# Patient Record
Sex: Female | Born: 1935 | Race: White | Hispanic: No | Marital: Married | Smoking: Never smoker
Health system: Southern US, Community
[De-identification: ages and names within clinical notes are randomized; demographics above are authoritative.]

## PROBLEM LIST (undated history)

## (undated) DIAGNOSIS — E785 Hyperlipidemia, unspecified: Secondary | ICD-10-CM

## (undated) DIAGNOSIS — I10 Essential (primary) hypertension: Secondary | ICD-10-CM

## (undated) HISTORY — PX: APPENDECTOMY: SHX54

## (undated) HISTORY — DX: Essential (primary) hypertension: I10

## (undated) HISTORY — DX: Hyperlipidemia, unspecified: E78.5

## (undated) HISTORY — PX: CHOLECYSTECTOMY: SHX55

---

## 2004-10-25 ENCOUNTER — Ambulatory Visit: Payer: Self-pay | Admitting: Internal Medicine

## 2005-10-20 ENCOUNTER — Inpatient Hospital Stay: Payer: Self-pay | Admitting: Internal Medicine

## 2005-10-22 ENCOUNTER — Other Ambulatory Visit: Payer: Self-pay

## 2005-11-13 ENCOUNTER — Ambulatory Visit: Payer: Self-pay | Admitting: Internal Medicine

## 2006-01-17 ENCOUNTER — Ambulatory Visit: Payer: Self-pay | Admitting: Ophthalmology

## 2006-01-17 ENCOUNTER — Emergency Department: Payer: Self-pay | Admitting: Emergency Medicine

## 2006-01-18 ENCOUNTER — Ambulatory Visit: Payer: Self-pay | Admitting: Emergency Medicine

## 2006-01-23 ENCOUNTER — Ambulatory Visit: Payer: Self-pay | Admitting: Ophthalmology

## 2006-02-06 ENCOUNTER — Ambulatory Visit: Payer: Self-pay | Admitting: Internal Medicine

## 2007-02-08 ENCOUNTER — Ambulatory Visit: Payer: Self-pay | Admitting: Internal Medicine

## 2007-08-10 IMAGING — CT CT ABDOMEN W/ CM
1 of 2 series · 15 of 32 positions shown, 19 images · non-contrast
Comparison: none

REASON FOR EXAM: Status post two weeks gallbladder surgery. Upper abdominal
pain
COMMENTS:

[Series 2: soft tissue · axial · 0.66mm/px · z∈[-607,-375]mm · 15 of 33 slices shown, 19 images]
[im 2/33  soft-tissue]
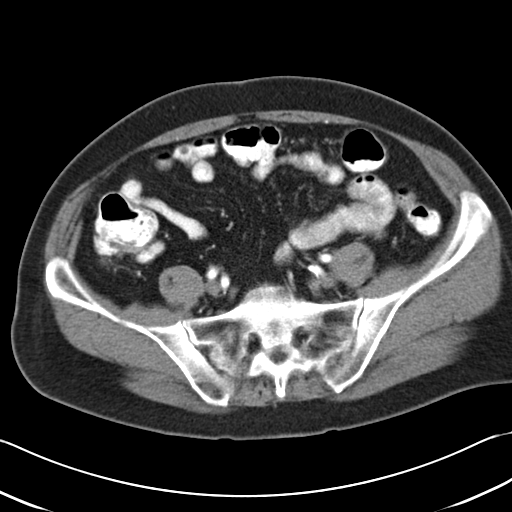
[im 2/33  bone]
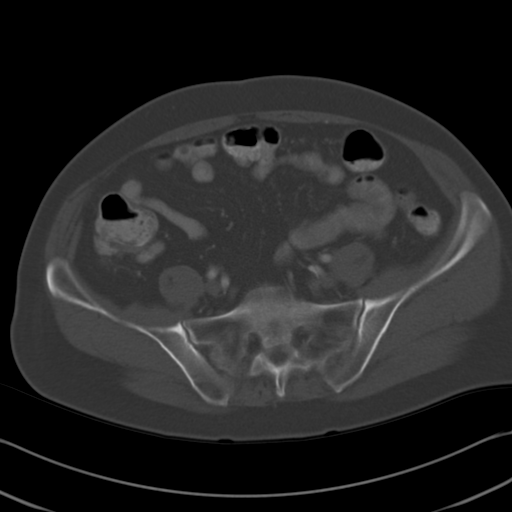
[im 5/33  soft-tissue]
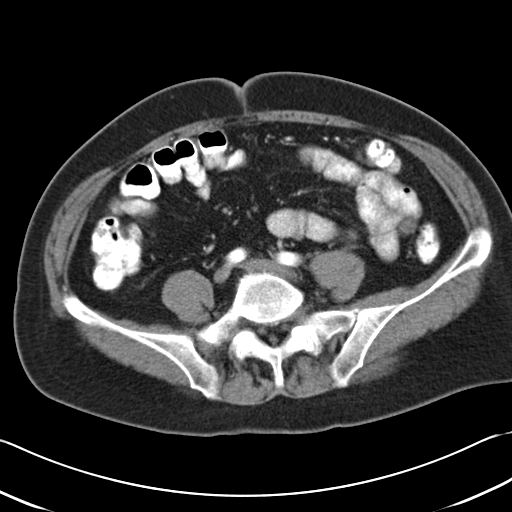
[im 8/33  soft-tissue]
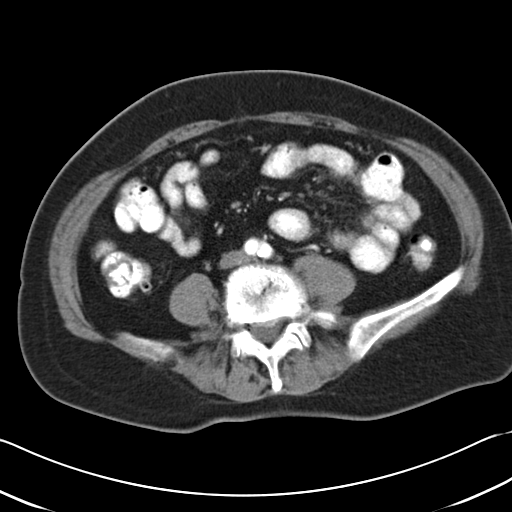
[im 9/33  soft-tissue]
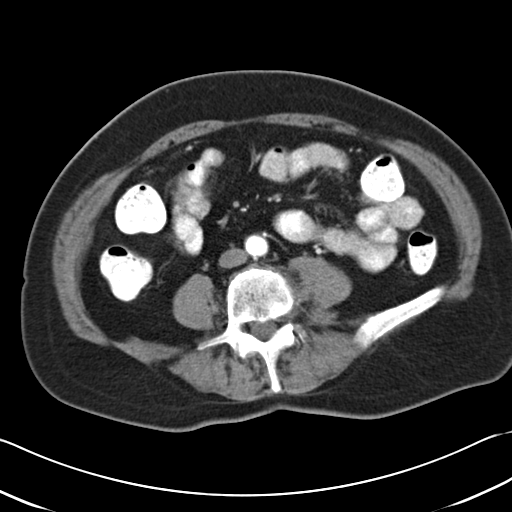
[im 12/33  soft-tissue]
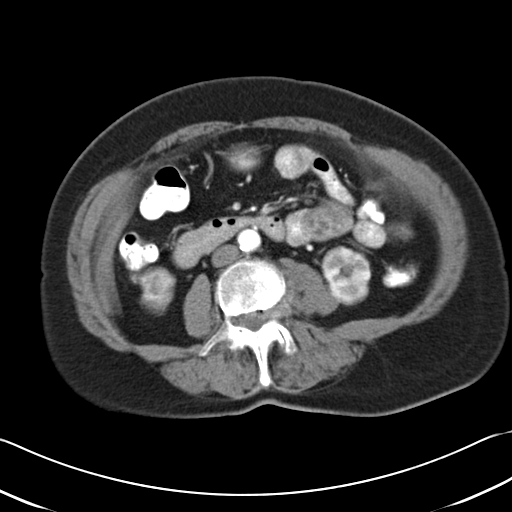
[im 14/33  soft-tissue]
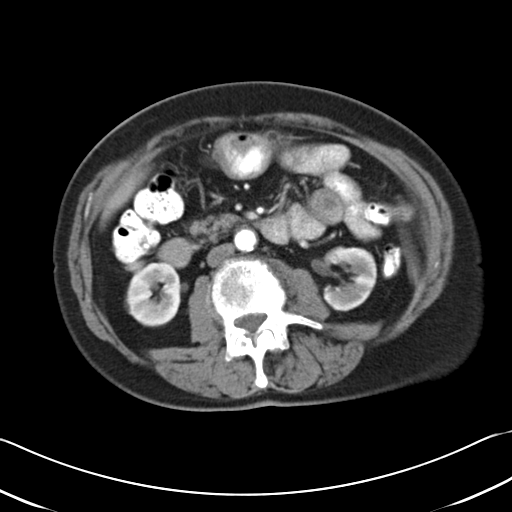
[im 17/33  soft-tissue]
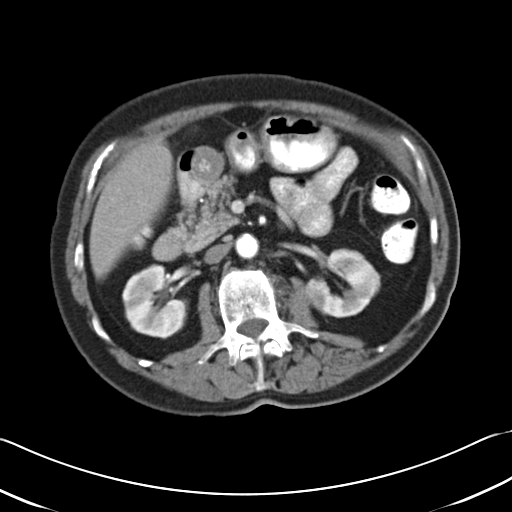
[im 19/33  soft-tissue]
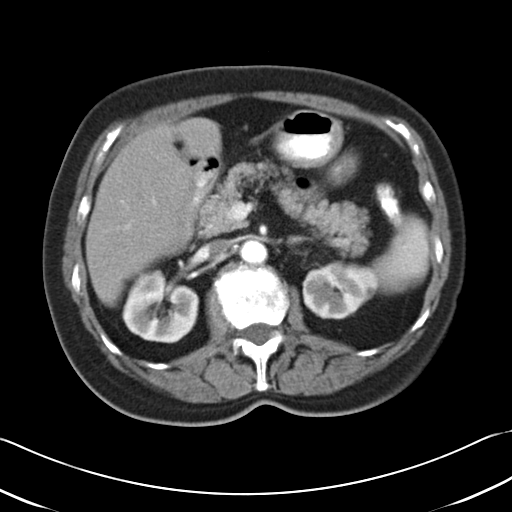
[im 21/33  soft-tissue]
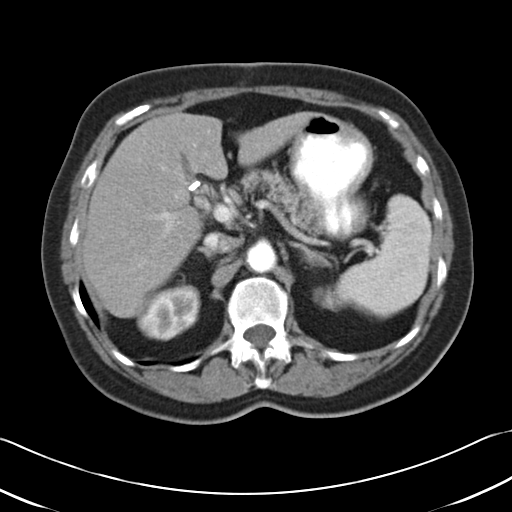
[im 21/33  bone]
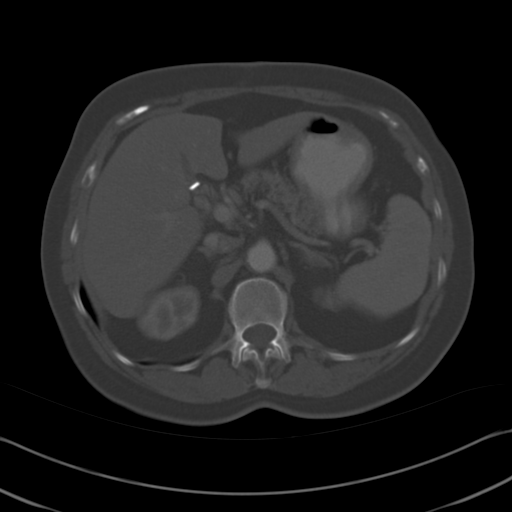
[im 24/33  soft-tissue]
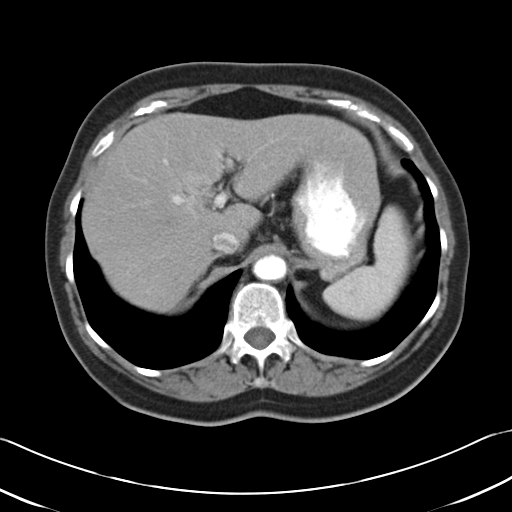
[im 25/33  soft-tissue]
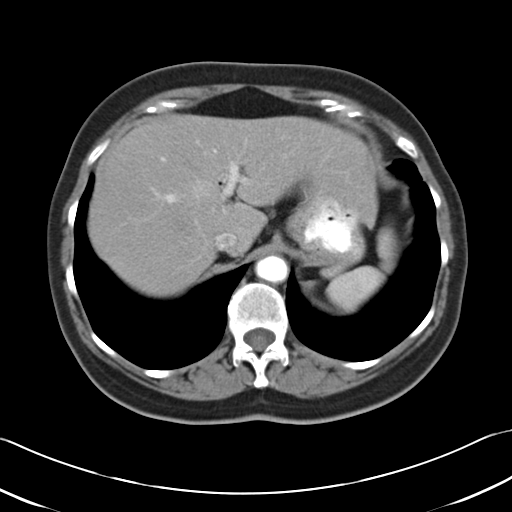
[im 27/33  lung]
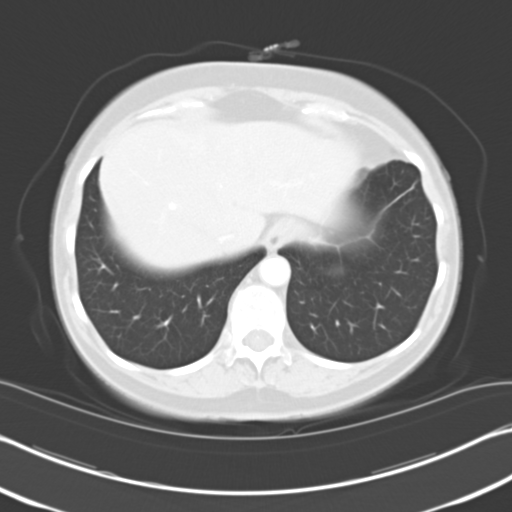
[im 28/33  soft-tissue]
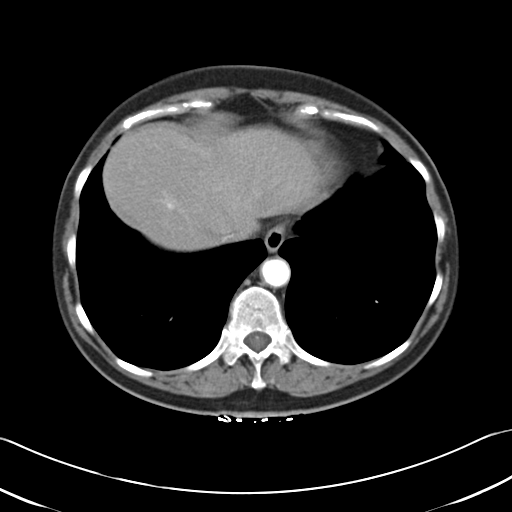
[im 28/33  lung]
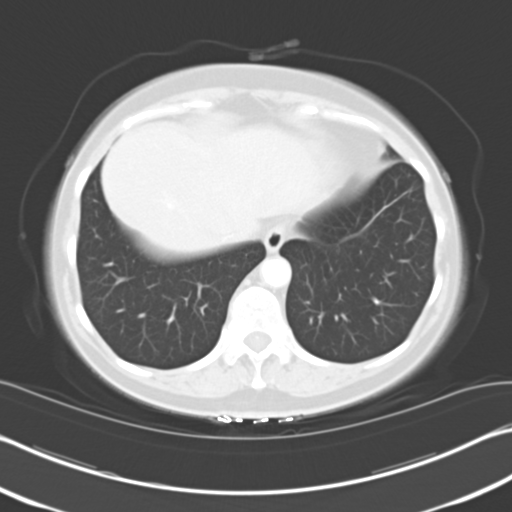
[im 30/33  lung]
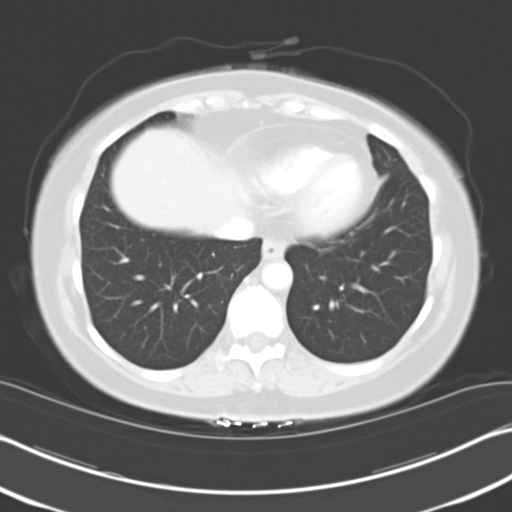
[im 31/33  soft-tissue]
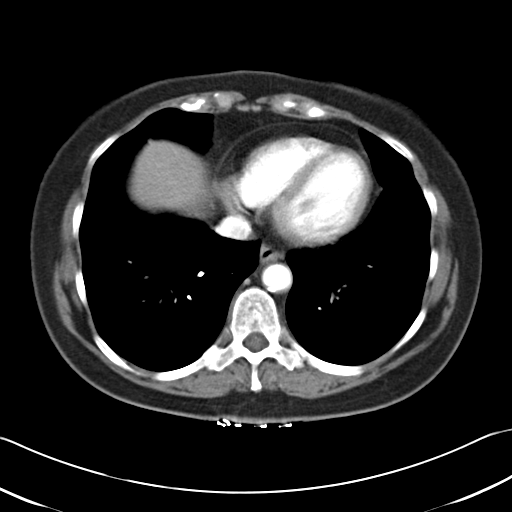
[im 31/33  lung]
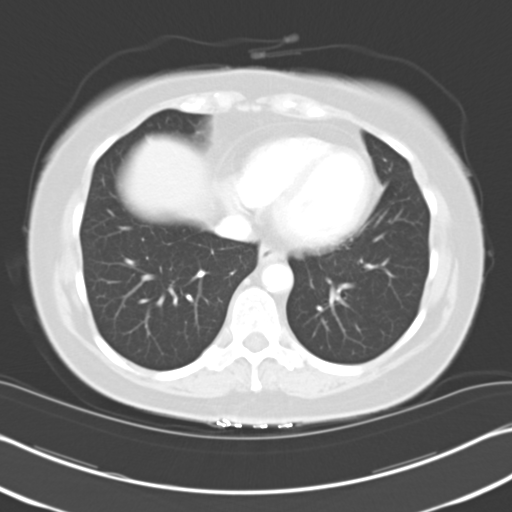

[15 of 32 positions shown; findings below may reference images not displayed]

PROCEDURE:     CT  - CT ABDOMEN STANDARD W  - November 13, 2005  [DATE]

RESULT:     Spiral 8-mm sections were obtained from the lung bases through
the superior iliac crest status post intravenous administration of 63 ml of
[id number].

Evaluation of the lung bases demonstrates no evidence of focal infiltrates,
effusions, or edema.

The liver, spleen, adrenals, and pancreas are unremarkable.  Evaluation of
the RIGHT and LEFT kidneys demonstrates bilateral cortical thinning.  An
area of low attenuation projects within the lateral lower pole region of the
LEFT kidney measuring approximately 5 to 6 mm in diameter too small for
characterization though likely representing a cyst.  No evidence of
abdominal free fluid, drainable loculated fluid collections, masses, or
adenopathy is appreciated.  Surgical clips are demonstrated within the
gallbladder fossa.  This is consistent with the patient's history of prior
cholecystectomy.  The celiac, SMA, IMA, portal veins are opacified and does
not reflect the caliber of the vessels.
IMPRESSION: Unremarkable CT of the abdomen as described above.

## 2008-02-11 ENCOUNTER — Ambulatory Visit: Payer: Self-pay | Admitting: Internal Medicine

## 2008-07-27 ENCOUNTER — Emergency Department: Payer: Self-pay | Admitting: Emergency Medicine

## 2008-07-28 ENCOUNTER — Other Ambulatory Visit: Payer: Self-pay

## 2009-03-09 ENCOUNTER — Ambulatory Visit: Payer: Self-pay | Admitting: Internal Medicine

## 2009-12-19 ENCOUNTER — Emergency Department: Payer: Self-pay | Admitting: Unknown Physician Specialty

## 2009-12-22 ENCOUNTER — Ambulatory Visit: Payer: Self-pay | Admitting: Gastroenterology

## 2010-03-10 ENCOUNTER — Ambulatory Visit: Payer: Self-pay | Admitting: Internal Medicine

## 2011-03-13 ENCOUNTER — Ambulatory Visit: Payer: Self-pay | Admitting: Internal Medicine

## 2011-09-15 IMAGING — US ABDOMEN ULTRASOUND
1 series · 17 of 25 positions shown · non-contrast
Comparison: none

REASON FOR EXAM: ruq pain vomiting  cholecystectomy
COMMENTS:   May transport without cardiac monitor

[Series 1: abdomen ultrasound · 17 of 51 slices shown]
[im 1/51]
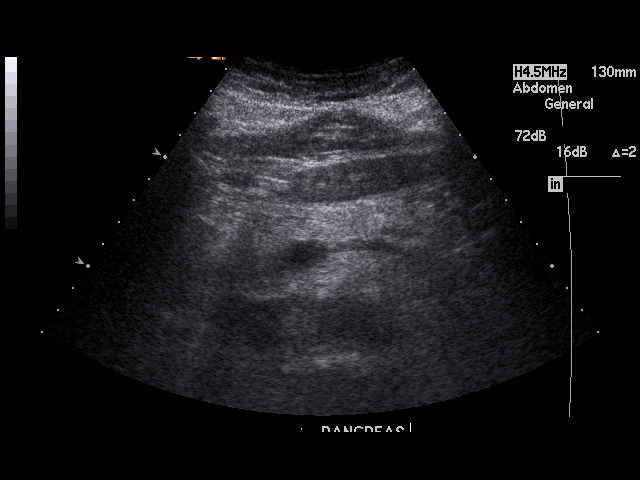
[im 5/51]
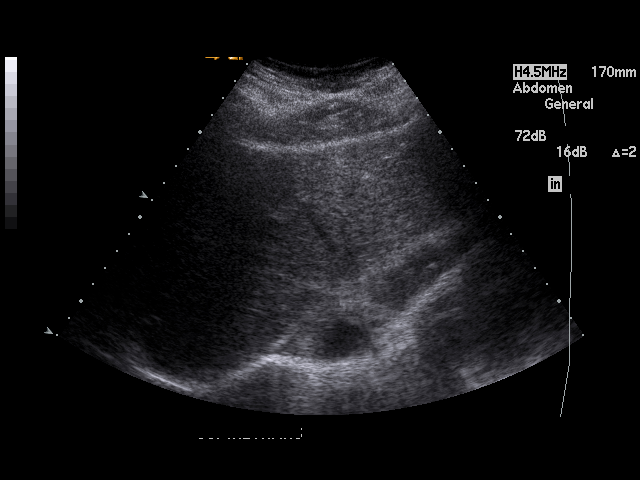
[im 7/51]
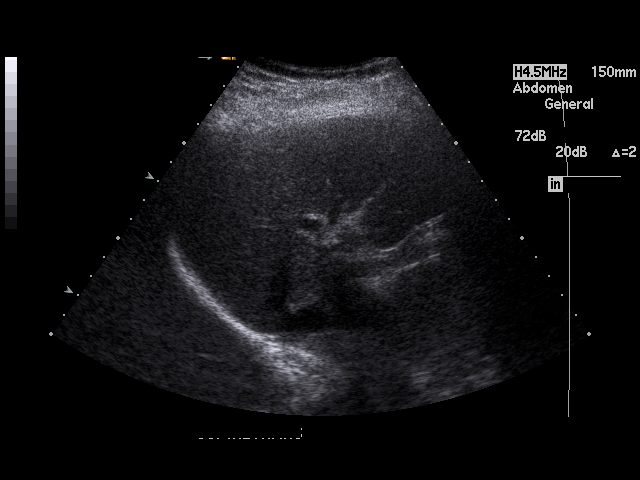
[im 11/51]
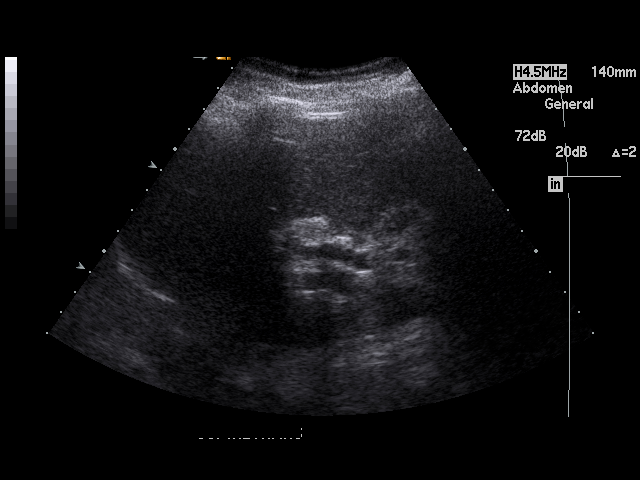
[im 13/51]
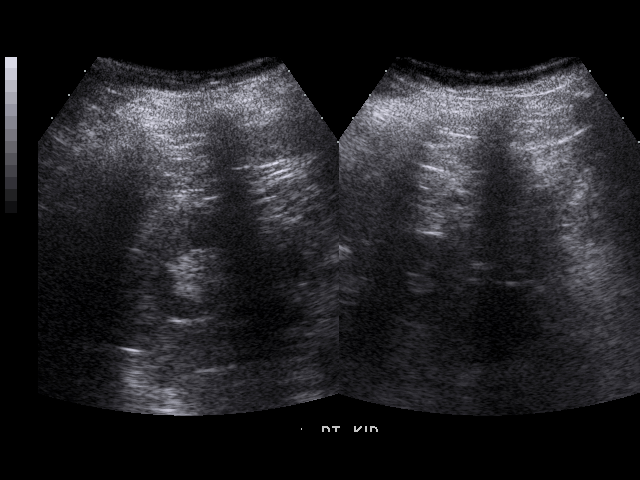
[im 17/51]
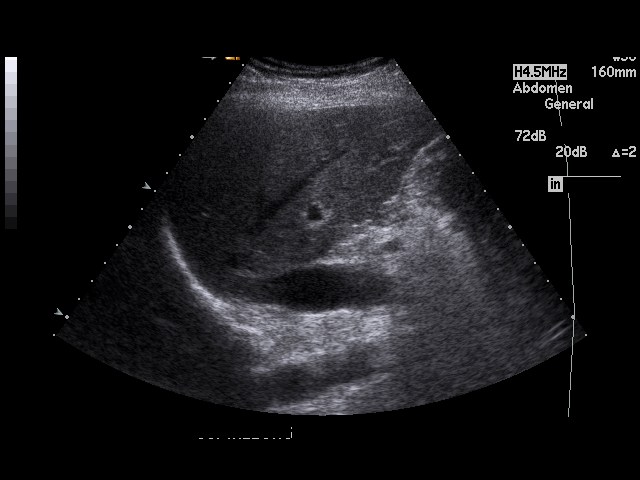
[im 19/51]
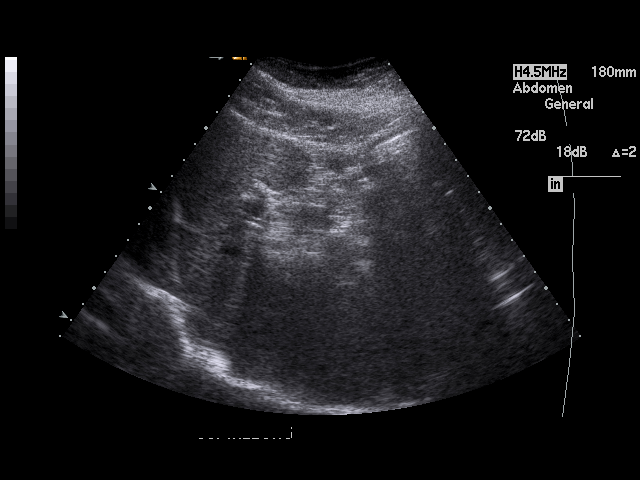
[im 23/51]
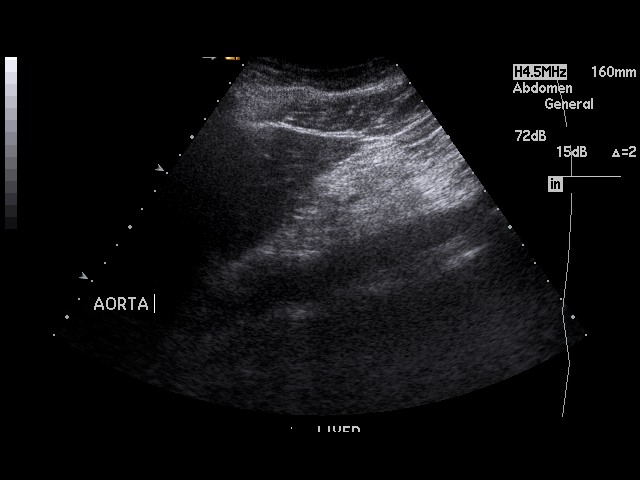
[im 26/51]
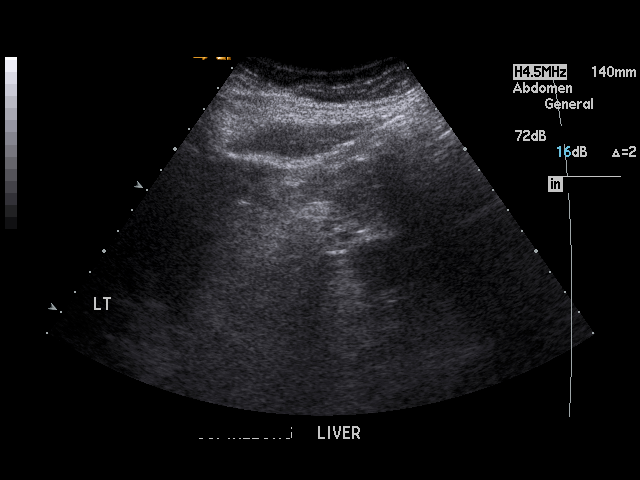
[im 28/51]
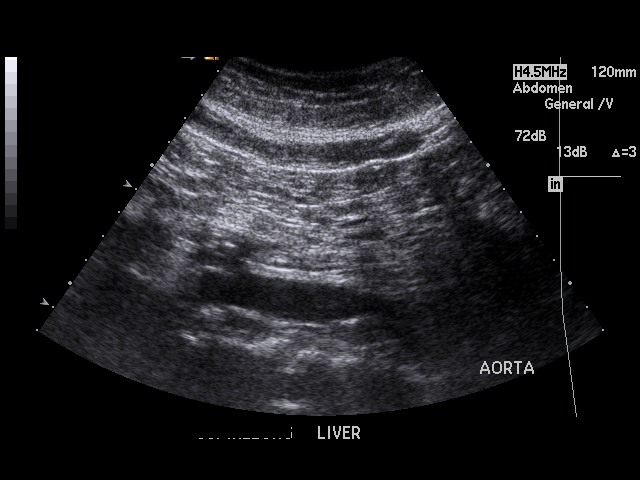
[im 32/51]
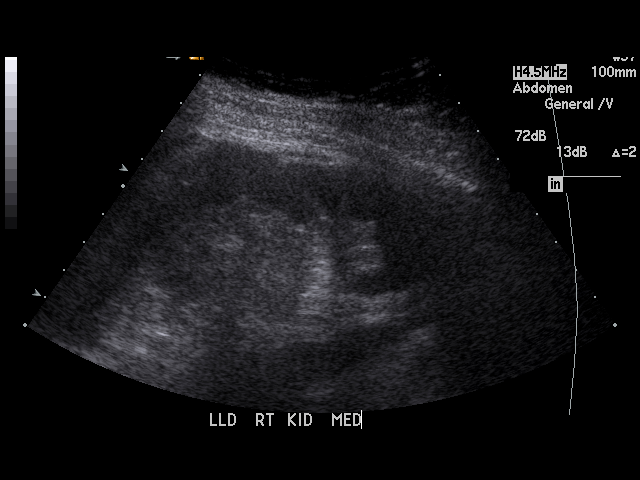
[im 34/51]
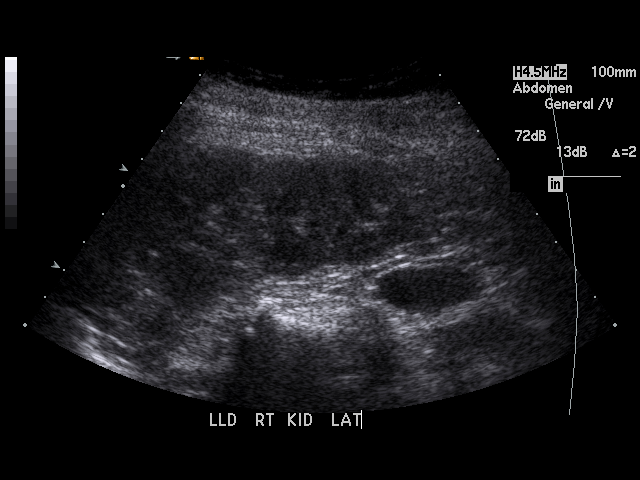
[im 38/51]
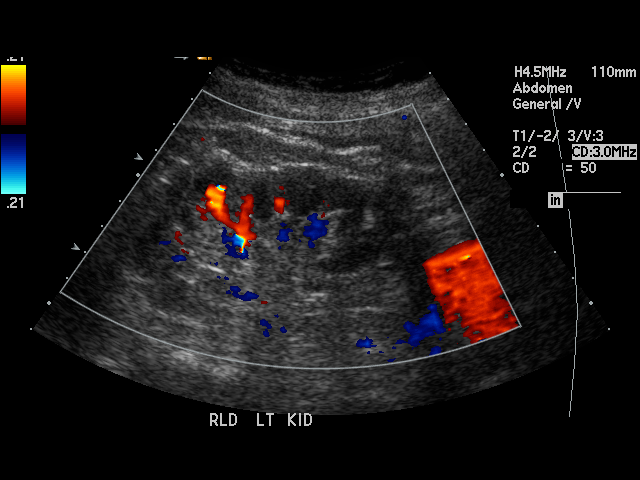
[im 40/51]
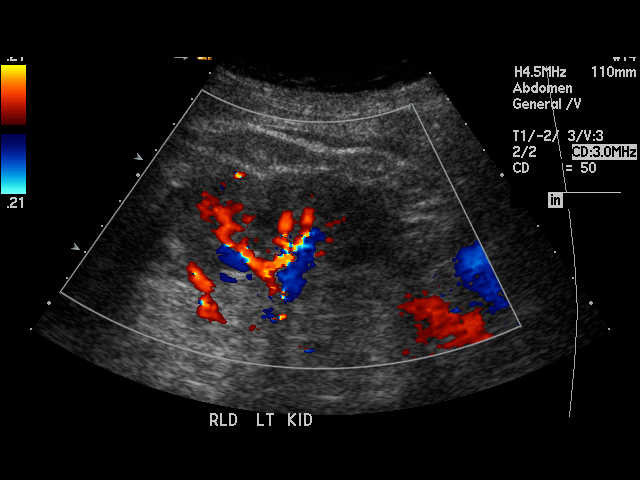
[im 44/51]
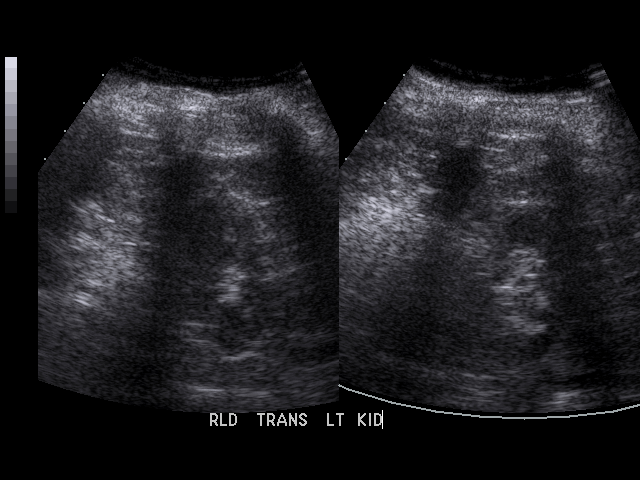
[im 46/51]
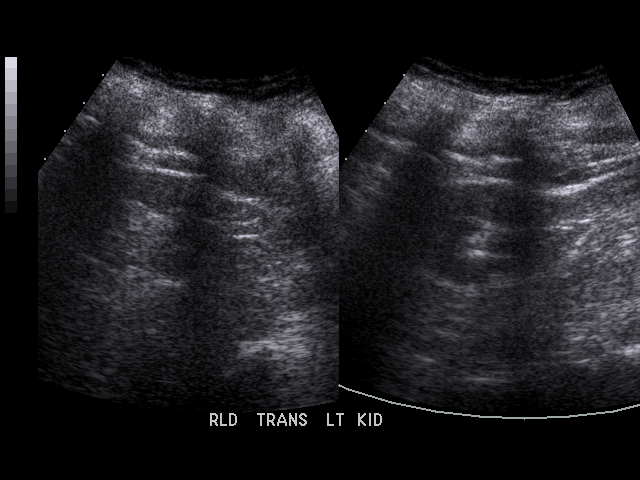
[im 51/51]
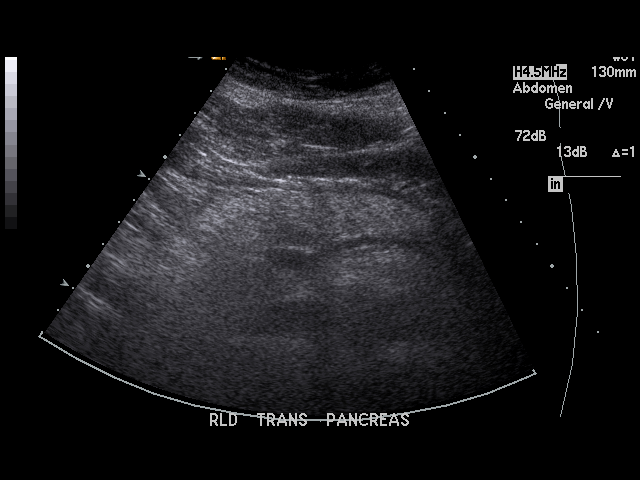

[17 of 25 positions shown; findings below may reference images not displayed]

PROCEDURE:     US  - US ABDOMEN GENERAL SURVEY  - December 19, 2009 [DATE]

RESULT:     Sonographic evaluation of the abdomen is performed. The patient
has a history of cholecystectomy. The common bile duct diameter is 6.0 mm.
The visualized pancreas appears unremarkable. The hepatic echotexture is
normal with no intrahepatic ductal dilation. The portal venous flow is
normal. The spleen is normal in size and echotexture. The visualized aorta
is unremarkable. The right kidney shows cortical thinning with no
obstruction. The left kidney is not well seen but shows no gross abnormality
convincingly on the images submitted. There is no ascites or abnormal fluid
collection.
IMPRESSION: 1.     No acute inflammatory or obstructive process. Status post
cholecystectomy. No ductal dilation.

## 2012-03-25 ENCOUNTER — Ambulatory Visit: Payer: Self-pay | Admitting: Internal Medicine

## 2013-03-26 ENCOUNTER — Ambulatory Visit: Payer: Self-pay | Admitting: Internal Medicine

## 2014-04-27 ENCOUNTER — Ambulatory Visit: Payer: Self-pay | Admitting: Internal Medicine

## 2014-05-29 DIAGNOSIS — N289 Disorder of kidney and ureter, unspecified: Secondary | ICD-10-CM | POA: Insufficient documentation

## 2017-08-16 ENCOUNTER — Encounter (INDEPENDENT_AMBULATORY_CARE_PROVIDER_SITE_OTHER): Payer: Self-pay

## 2017-08-16 ENCOUNTER — Ambulatory Visit (INDEPENDENT_AMBULATORY_CARE_PROVIDER_SITE_OTHER): Payer: Self-pay | Admitting: Vascular Surgery

## 2018-11-04 ENCOUNTER — Ambulatory Visit (INDEPENDENT_AMBULATORY_CARE_PROVIDER_SITE_OTHER): Payer: Medicare Other

## 2018-11-04 ENCOUNTER — Ambulatory Visit (INDEPENDENT_AMBULATORY_CARE_PROVIDER_SITE_OTHER): Payer: Medicare Other | Admitting: Vascular Surgery

## 2018-11-04 ENCOUNTER — Other Ambulatory Visit (INDEPENDENT_AMBULATORY_CARE_PROVIDER_SITE_OTHER): Payer: Self-pay | Admitting: Vascular Surgery

## 2018-11-04 ENCOUNTER — Encounter (INDEPENDENT_AMBULATORY_CARE_PROVIDER_SITE_OTHER): Payer: Self-pay | Admitting: Vascular Surgery

## 2018-11-04 DIAGNOSIS — E782 Mixed hyperlipidemia: Secondary | ICD-10-CM

## 2018-11-04 DIAGNOSIS — I6529 Occlusion and stenosis of unspecified carotid artery: Secondary | ICD-10-CM

## 2018-11-04 DIAGNOSIS — I6523 Occlusion and stenosis of bilateral carotid arteries: Secondary | ICD-10-CM

## 2018-11-04 DIAGNOSIS — Z7982 Long term (current) use of aspirin: Secondary | ICD-10-CM

## 2018-11-04 DIAGNOSIS — K219 Gastro-esophageal reflux disease without esophagitis: Secondary | ICD-10-CM

## 2018-11-09 DIAGNOSIS — I6529 Occlusion and stenosis of unspecified carotid artery: Secondary | ICD-10-CM | POA: Insufficient documentation

## 2018-11-09 DIAGNOSIS — K219 Gastro-esophageal reflux disease without esophagitis: Secondary | ICD-10-CM | POA: Insufficient documentation

## 2018-11-09 DIAGNOSIS — E785 Hyperlipidemia, unspecified: Secondary | ICD-10-CM | POA: Insufficient documentation

## 2018-11-11 ENCOUNTER — Encounter (INDEPENDENT_AMBULATORY_CARE_PROVIDER_SITE_OTHER): Payer: Self-pay | Admitting: Vascular Surgery

## 2018-11-11 NOTE — Progress Notes (Signed)
MRN : [id number]  Kelly Shea is a 84 y.o. (12/26/1930) female who presents with chief complaint of  Chief Complaint  Patient presents with  . Follow-up    ref [facility] for carotid stenosis  .  History of Present Illness:  The patient is seen for follow up evaluation of carotid stenosis. The carotid stenosis followed by ultrasound.   The patient denies amaurosis fugax. There is no recent history of TIA symptoms or focal motor deficits. There is no prior documented CVA.  The patient is taking enteric-coated aspirin 81 mg daily.  There is no history of migraine headaches. There is no history of seizures.  The patient has a history of coronary artery disease, no recent episodes of angina or shortness of breath. The patient denies PAD or claudication symptoms. There is a history of hyperlipidemia which is being treated with a statin.    Carotid Duplex done today shows RICA <30% and LICA 40-59%.  No change compared to last study  Current Meds  Medication Sig  . atorvastatin (LIPITOR) 20 MG tablet Take 20 mg by mouth daily.  . hydrOXYzine (ATARAX/VISTARIL) 25 MG tablet   . olmesartan (BENICAR) 40 MG tablet Take 40 mg by mouth daily.  Marland Kitchen omeprazole (PRILOSEC) 20 MG capsule Take by mouth daily.    Past Medical History:  Diagnosis Date  . Hyperlipidemia   . Hypertension     Past Surgical History:  Procedure Laterality Date  . APPENDECTOMY    . CHOLECYSTECTOMY      Social History Social History   Tobacco Use  . Smoking status: Never Smoker  . Smokeless tobacco: Never Used  Substance Use Topics  . Alcohol use: Never    Frequency: Never  . Drug use: Never    Family History Family History  Problem Relation Age of Onset  . Heart disease Mother   . Heart disease Father   . Lung cancer Brother     No Known Allergies   REVIEW OF SYSTEMS (Negative unless checked)  Constitutional: [] Weight loss  [] Fever  [] Chills Cardiac: [] Chest pain   [] Chest pressure    [] Palpitations   [] Shortness of breath when laying flat   [] Shortness of breath with exertion. Vascular:  [] Pain in legs with walking   [] Pain in legs at rest  [] History of DVT   [] Phlebitis   [] Swelling in legs   [] Varicose veins   [] Non-healing ulcers Pulmonary:   [] Uses home oxygen   [] Productive cough   [] Hemoptysis   [] Wheeze  [] COPD   [] Asthma Neurologic:  [] Dizziness   [] Seizures   [] History of stroke   [] History of TIA  [] Aphasia   [] Vissual changes   [] Weakness or numbness in arm   [] Weakness or numbness in leg Musculoskeletal:   [] Joint swelling   [] Joint pain   [] Low back pain Hematologic:  [] Easy bruising  [] Easy bleeding   [] Hypercoagulable state   [] Anemic Gastrointestinal:  [] Diarrhea   [] Vomiting  [] Gastroesophageal reflux/heartburn   [] Difficulty swallowing. Genitourinary:  [] Chronic kidney disease   [] Difficult urination  [] Frequent urination   [] Blood in urine Skin:  [] Rashes   [] Ulcers  Psychological:  [] History of anxiety   []  History of major depression.  Physical Examination  Vitals:   11/04/18 1451  BP: 126/67  Pulse: 66  Resp: 16  Weight: 118 lb 6.4 oz (53.7 kg)  Height: 5\' 4"  (1.626 m)   Body mass index is 20.32 kg/m. Gen: WD/WN, NAD Head: [place]/AT, No temporalis wasting.  Ear/Nose/Throat: Hearing  grossly intact, nares w/o erythema or drainage Eyes: PER, EOMI, sclera nonicteric.  Neck: Supple, no large masses.   Pulmonary:  Good air movement, no audible wheezing bilaterally, no use of accessory muscles.  Cardiac: RRR, no JVD Vascular:  Left carotid bruit Vessel Right Left  Radial Palpable Palpable  Ulnar Palpable Palpable  Brachial Palpable Palpable  Carotid Palpable Palpable  Gastrointestinal: Non-distended. No guarding/no peritoneal signs.  Musculoskeletal: M/S 5/5 throughout.  No deformity or atrophy.  Neurologic: CN 2-12 intact. Symmetrical.  Speech is fluent. Motor exam as listed above. Psychiatric: Judgment intact, Mood & affect appropriate for pt's  clinical situation. Dermatologic: No rashes or ulcers noted.  No changes consistent with cellulitis. Lymph : No lichenification or skin changes of chronic lymphedema.  CBC No results found for: WBC, HGB, HCT, MCV, PLT  BMET No results found for: NA, K, CL, CO2, GLUCOSE, BUN, CREATININE, CALCIUM, GFRNONAA, GFRAA CrCl cannot be calculated (No successful lab value found.).  COAG No results found for: INR, PROTIME  Radiology Vas [place] Carotid  Result Date: 11/04/2018 Carotid Arterial Duplex Study Indications:       Carotid artery disease. Comparison Study:  No previous exam available for comparison. Performing Technologist: Jamse MeadWilliam Hosmer RT, RDMS, RVT  Examination Guidelines: A complete evaluation includes B-mode imaging, spectral Doppler, color Doppler, and power Doppler as needed of all accessible portions of each vessel. Bilateral testing is considered an integral part of a complete examination. Limited examinations for reoccurring indications may be performed as noted.  Right Carotid Findings: +----------+--------+--------+--------+------------+--------+           PSV cm/sEDV cm/sStenosisDescribe    Comments +----------+--------+--------+--------+------------+--------+ CCA Prox  108     11                                   +----------+--------+--------+--------+------------+--------+ CCA Mid   105     14                                   +----------+--------+--------+--------+------------+--------+ CCA Distal85      14              heterogenous         +----------+--------+--------+--------+------------+--------+ ICA Prox  112     16      1-39%   heterogenous         +----------+--------+--------+--------+------------+--------+ ICA Mid   84      14                                   +----------+--------+--------+--------+------------+--------+ ICA Distal91      20                                    +----------+--------+--------+--------+------------+--------+ ECA       127     8               heterogenous         +----------+--------+--------+--------+------------+--------+ Right ICA/CCA ratio=1.3  Left Carotid Findings: +----------+--------+--------+--------+------------+--------+           PSV cm/sEDV cm/sStenosisDescribe    Comments +----------+--------+--------+--------+------------+--------+ CCA Prox  133     21                                   +----------+--------+--------+--------+------------+--------+  CCA Mid   103     14              calcific             +----------+--------+--------+--------+------------+--------+ CCA Distal79      16              heterogenous         +----------+--------+--------+--------+------------+--------+ ICA Prox  160     26      40-59%  calcific             +----------+--------+--------+--------+------------+--------+ ICA Mid   102     19                                   +----------+--------+--------+--------+------------+--------+ ICA Distal77      20                                   +----------+--------+--------+--------+------------+--------+ ECA       162     15      >50%    calcific             +----------+--------+--------+--------+------------+--------+ Left ICA/CCA ratio=2.03  Summary: Right Carotid: The extracranial vessels were near-normal with only minimal wall                thickening or plaque. Left Carotid: Velocities in the left ICA are consistent with a 40-59% stenosis.               The ECA appears >50% stenosed. Vertebrals:  Bilateral vertebral arteries demonstrate antegrade flow. Subclavians: Normal flow hemodynamics were seen in bilateral subclavian              arteries. *See table(s) above for measurements and observations.  Electronically signed by Levora Dredge MD on 11/04/2018 at 5:40:21 PM.    Final      Assessment/Plan 1. Bilateral carotid artery  stenosis Recommend:  Given the patient's asymptomatic subcritical stenosis no further invasive testing or surgery at this time.  Duplex ultrasound shows <50% stenosis bilaterally.  Continue antiplatelet therapy as prescribed Continue management of CAD, HTN and Hyperlipidemia Healthy heart diet,  encouraged exercise at least 4 times per week Follow up in 12 months with duplex ultrasound and physical exam   - VAS US CAROTID; Future  2. Gastroesophageal reflux disease without esophagitis Continue PPI as already ordered, this medication has been reviewed and there are no changes at this time.  Avoidence of caffeine and alcohol  Moderate elevation of the head of the bed   3. Mixed hyperlipidemia Continue statin as ordered and reviewed, no changes at this time     Levora Dredge, MD  11/11/2018 9:23 AM

## 2018-12-09 ENCOUNTER — Encounter (INDEPENDENT_AMBULATORY_CARE_PROVIDER_SITE_OTHER): Payer: Self-pay

## 2019-01-27 DIAGNOSIS — D631 Anemia in chronic kidney disease: Secondary | ICD-10-CM | POA: Insufficient documentation

## 2019-01-27 DIAGNOSIS — I6523 Occlusion and stenosis of bilateral carotid arteries: Secondary | ICD-10-CM | POA: Insufficient documentation

## 2019-10-30 DIAGNOSIS — E875 Hyperkalemia: Secondary | ICD-10-CM | POA: Insufficient documentation

## 2019-11-10 ENCOUNTER — Other Ambulatory Visit: Payer: Self-pay

## 2019-11-10 ENCOUNTER — Ambulatory Visit (INDEPENDENT_AMBULATORY_CARE_PROVIDER_SITE_OTHER): Payer: Medicare Other

## 2019-11-10 ENCOUNTER — Ambulatory Visit (INDEPENDENT_AMBULATORY_CARE_PROVIDER_SITE_OTHER): Payer: Medicare Other | Admitting: Vascular Surgery

## 2019-11-10 ENCOUNTER — Encounter (INDEPENDENT_AMBULATORY_CARE_PROVIDER_SITE_OTHER): Payer: Self-pay | Admitting: Vascular Surgery

## 2019-11-10 VITALS — BP 155/73 | HR 70 | Resp 16 | Wt 116.4 lb

## 2019-11-10 DIAGNOSIS — I10 Essential (primary) hypertension: Secondary | ICD-10-CM | POA: Diagnosis not present

## 2019-11-10 DIAGNOSIS — J3089 Other allergic rhinitis: Secondary | ICD-10-CM | POA: Insufficient documentation

## 2019-11-10 DIAGNOSIS — E782 Mixed hyperlipidemia: Secondary | ICD-10-CM | POA: Diagnosis not present

## 2019-11-10 DIAGNOSIS — I6523 Occlusion and stenosis of bilateral carotid arteries: Secondary | ICD-10-CM

## 2019-11-10 DIAGNOSIS — K219 Gastro-esophageal reflux disease without esophagitis: Secondary | ICD-10-CM | POA: Diagnosis not present

## 2019-11-10 DIAGNOSIS — R519 Headache, unspecified: Secondary | ICD-10-CM | POA: Insufficient documentation

## 2019-11-10 DIAGNOSIS — N289 Disorder of kidney and ureter, unspecified: Secondary | ICD-10-CM

## 2019-11-10 NOTE — Progress Notes (Signed)
MRN : [id number]  Kelly Shea is a 85 y.o. ([birth year]) female who presents with chief complaint of  Chief Complaint  Patient presents with  . Follow-up    ultrasound follow up  .  History of Present Illness:   The patient is seen for follow up evaluation of carotid stenosis. The carotid stenosis followed by ultrasound.   The patient denies amaurosis fugax. There is no recent history of TIA symptoms or focal motor deficits. There is no prior documented CVA.  The patient is taking enteric-coated aspirin 81 mg daily.  There is no history of migraine headaches. There is no history of seizures.  The patient has a history of coronary artery disease, no recent episodes of angina or shortness of breath. The patient denies PAD or claudication symptoms. There is a history of hyperlipidemia which is being treated with a statin.   Carotid Duplex done today shows RICA <74% and LICA 25-95%.  No change compared to last study  Current Meds  Medication Sig  . atorvastatin (LIPITOR) 20 MG tablet Take 20 mg by mouth daily.  . hydrOXYzine (ATARAX/VISTARIL) 25 MG tablet   . Multiple Vitamins-Minerals (PRESERVISION AREDS 2 PO) Take by mouth.  . olmesartan (BENICAR) 40 MG tablet Take 40 mg by mouth daily.  Marland Kitchen omeprazole (PRILOSEC) 20 MG capsule Take by mouth daily.  . Red Yeast Rice Extract (RED YEAST RICE PO) Take by mouth.    Past Medical History:  Diagnosis Date  . Hyperlipidemia   . Hypertension     Past Surgical History:  Procedure Laterality Date  . APPENDECTOMY    . CHOLECYSTECTOMY      Social History Social History   Tobacco Use  . Smoking status: Never Smoker  . Smokeless tobacco: Never Used  Substance Use Topics  . Alcohol use: Never    Frequency: Never  . Drug use: Never    Family History Family History  Problem Relation Age of Onset  . Heart disease Mother   . Heart disease Father   . Lung cancer Brother     Allergies  Allergen Reactions  .  Nitrofurantoin Other (See Comments)  . Statins Other (See Comments)    EXCEPT LOW DOSE CRESTOR     REVIEW OF SYSTEMS (Negative unless checked)  Constitutional: [] Weight loss  [] Fever  [] Chills Cardiac: [] Chest pain   [] Chest pressure   [] Palpitations   [] Shortness of breath when laying flat   [] Shortness of breath with exertion. Vascular:  [] Pain in legs with walking   [] Pain in legs at rest  [] History of DVT   [] Phlebitis   [] Swelling in legs   [] Varicose veins   [] Non-healing ulcers Pulmonary:   [] Uses home oxygen   [] Productive cough   [] Hemoptysis   [] Wheeze  [] COPD   [] Asthma Neurologic:  [] Dizziness   [] Seizures   [] History of stroke   [] History of TIA  [] Aphasia   [] Vissual changes   [] Weakness or numbness in arm   [] Weakness or numbness in leg Musculoskeletal:   [] Joint swelling   [] Joint pain   [] Low back pain Hematologic:  [] Easy bruising  [] Easy bleeding   [] Hypercoagulable state   [] Anemic Gastrointestinal:  [] Diarrhea   [] Vomiting  [] Gastroesophageal reflux/heartburn   [] Difficulty swallowing. Genitourinary:  [] Chronic kidney disease   [] Difficult urination  [] Frequent urination   [] Blood in urine Skin:  [] Rashes   [] Ulcers  Psychological:  [] History of anxiety   []  History of major depression.  Physical Examination  Vitals:  11/10/19 1019  BP: (!) 155/73  Pulse: 70  Resp: 16  Weight: 116 lb 6.4 oz (52.8 kg)   Body mass index is 19.98 kg/m. Gen: WD/WN, NAD Head: [place]/AT, No temporalis wasting.  Ear/Nose/Throat: Hearing grossly intact, nares w/o erythema or drainage Eyes: PER, EOMI, sclera nonicteric.  Neck: Supple, no large masses.   Pulmonary:  Good air movement, no audible wheezing bilaterally, no use of accessory muscles.  Cardiac: RRR, no JVD Vascular:  Vessel Right Left  Radial Palpable Palpable  Brachial Palpable Palpable  Carotid Palpable Palpable  Gastrointestinal: Non-distended. No guarding/no peritoneal signs.  Musculoskeletal: M/S 5/5 throughout.  No  deformity or atrophy.  Neurologic: CN 2-12 intact. Symmetrical.  Speech is fluent. Motor exam as listed above. Psychiatric: Judgment intact, Mood & affect appropriate for pt's clinical situation. Dermatologic: No rashes or ulcers noted.  No changes consistent with cellulitis. Lymph : No lichenification or skin changes of chronic lymphedema.  CBC No results found for: WBC, HGB, HCT, MCV, PLT  BMET No results found for: NA, K, CL, CO2, GLUCOSE, BUN, CREATININE, CALCIUM, GFRNONAA, GFRAA CrCl cannot be calculated (No successful lab value found.).  COAG No results found for: INR, PROTIME  Radiology No results found.   Assessment/Plan 1. Carotid stenosis, asymptomatic, bilateral Recommend:  Given the patient's asymptomatic subcritical stenosis no further invasive testing or surgery at this time.  Duplex ultrasound shows <50% stenosis bilaterally which has been unchanged when compared to the previous studies.  Continue antiplatelet therapy as prescribed Continue management of CAD, HTN and Hyperlipidemia Healthy heart diet,  encouraged exercise at least 4 times per week  Given the stable <50% bilateral carotid stenosis in association with the patient's age the patient will follow up PRN.  The patient is told that if symptoms of a TIA should occur then he should go to the ER and I should be notified, as this would change the management course.  The patient voices understanding.   2. Essential hypertension Continue antihypertensive medications as already ordered, these medications have been reviewed and there are no changes at this time.   3. Gastroesophageal reflux disease without esophagitis Continue PPI as already ordered, this medication has been reviewed and there are no changes at this time.  Avoidence of caffeine and alcohol  Moderate elevation of the head of the bed   4. Mixed hyperlipidemia Continue statin as ordered and reviewed, no changes at this time   5. Renal  insufficiency Avoid nephrotoxic medications and dehydration.  Further plans per nephrology    Levora Dredge, MD  11/10/2019 10:33 AM

## 2020-02-04 ENCOUNTER — Other Ambulatory Visit (INDEPENDENT_AMBULATORY_CARE_PROVIDER_SITE_OTHER): Payer: Self-pay | Admitting: Nephrology

## 2020-02-04 DIAGNOSIS — N183 Chronic kidney disease, stage 3 unspecified: Secondary | ICD-10-CM

## 2020-02-05 ENCOUNTER — Encounter (INDEPENDENT_AMBULATORY_CARE_PROVIDER_SITE_OTHER): Payer: Self-pay

## 2020-02-05 ENCOUNTER — Ambulatory Visit (INDEPENDENT_AMBULATORY_CARE_PROVIDER_SITE_OTHER): Payer: Medicare Other

## 2020-02-05 ENCOUNTER — Other Ambulatory Visit: Payer: Self-pay

## 2020-02-05 DIAGNOSIS — N183 Chronic kidney disease, stage 3 unspecified: Secondary | ICD-10-CM

## 2023-02-20 ENCOUNTER — Encounter (INDEPENDENT_AMBULATORY_CARE_PROVIDER_SITE_OTHER): Payer: Self-pay | Admitting: Vascular Surgery

## 2023-02-20 ENCOUNTER — Ambulatory Visit (INDEPENDENT_AMBULATORY_CARE_PROVIDER_SITE_OTHER): Payer: Medicare Other | Admitting: Vascular Surgery

## 2023-02-20 VITALS — BP 161/69 | HR 73 | Resp 16 | Wt 115.6 lb

## 2023-02-20 DIAGNOSIS — I6523 Occlusion and stenosis of bilateral carotid arteries: Secondary | ICD-10-CM

## 2023-02-20 DIAGNOSIS — E782 Mixed hyperlipidemia: Secondary | ICD-10-CM | POA: Diagnosis not present

## 2023-02-20 DIAGNOSIS — I10 Essential (primary) hypertension: Secondary | ICD-10-CM

## 2023-02-20 NOTE — Progress Notes (Signed)
Patient ID: Kelly Shea, female   DOB: 15-Jul-1931, 89 y.o.   MRN: [id number]  Chief Complaint  Patient presents with   New Patient (Initial Visit)    Ref [facility] consult bilateral carotid stenosis    HPI Kelly Shea is a 89 y.o. female.  I am asked to see the patient by Dr. Doy Hutching for evaluation of carotid stenosis.  The patient has had a recent duplex showing 50-69% right ICA stenosis and <50% left ICA stenosis.  This would be slight progression from previous study back in 2020 that showed fairly mild disease bilaterally.  She denies any focal symptoms of cerebrovascular ischemia. Specifically, the patient denies amaurosis fugax, speech or swallowing difficulties, or arm or leg weakness or numbness.     Past Medical History:  Diagnosis Date   Hyperlipidemia    Hypertension     Past Surgical History:  Procedure Laterality Date   APPENDECTOMY     CHOLECYSTECTOMY       Family History  Problem Relation Age of Onset   Heart disease Mother    Heart disease Father    Lung cancer Brother   No bleeding or clotting disorders   Social History   Tobacco Use   Smoking status: Never   Smokeless tobacco: Never  Substance Use Topics   Alcohol use: Never   Drug use: Never     Allergies  Allergen Reactions   Nitrofurantoin Other (See Comments)   Statins Other (See Comments)    EXCEPT LOW DOSE CRESTOR    Current Outpatient Medications  Medication Sig Dispense Refill   atorvastatin (LIPITOR) 20 MG tablet Take 20 mg by mouth daily.  1   cetirizine (ZYRTEC) 10 MG tablet Take 10 mg by mouth daily.     hydrOXYzine (ATARAX/VISTARIL) 25 MG tablet      magnesium oxide (MAG-OX) 400 MG tablet Take 400 mg by mouth daily.     Multiple Vitamins-Minerals (PRESERVISION AREDS 2 PO) Take by mouth.     olmesartan (BENICAR) 40 MG tablet Take 40 mg by mouth daily.  6   omeprazole (PRILOSEC) 20 MG capsule Take by mouth daily.  5   propranolol (INDERAL) 10 MG tablet Take 1 tablet by  mouth 2 (two) times daily.     Red Yeast Rice Extract (RED YEAST RICE PO) Take by mouth.     No current facility-administered medications for this visit.      REVIEW OF SYSTEMS (Negative unless checked)  Constitutional: '[]'$ Weight loss  '[]'$ Fever  '[]'$ Chills Cardiac: '[]'$ Chest pain   '[]'$ Chest pressure   '[]'$ Palpitations   '[]'$ Shortness of breath when laying flat   '[]'$ Shortness of breath at rest   '[]'$ Shortness of breath with exertion. Vascular:  '[]'$ Pain in legs with walking   '[]'$ Pain in legs at rest   '[]'$ Pain in legs when laying flat   '[]'$ Claudication   '[]'$ Pain in feet when walking  '[]'$ Pain in feet at rest  '[]'$ Pain in feet when laying flat   '[]'$ History of DVT   '[]'$ Phlebitis   '[]'$ Swelling in legs   '[]'$ Varicose veins   '[]'$ Non-healing ulcers Pulmonary:   '[]'$ Uses home oxygen   '[]'$ Productive cough   '[]'$ Hemoptysis   '[]'$ Wheeze  '[]'$ COPD   '[]'$ Asthma Neurologic:  '[]'$ Dizziness  '[]'$ Blackouts   '[]'$ Seizures   '[]'$ History of stroke   '[]'$ History of TIA  '[]'$ Aphasia   '[]'$ Temporary blindness   '[]'$ Dysphagia   '[]'$ Weakness or numbness in arms   '[]'$ Weakness or numbness in legs Musculoskeletal:  '[x]'$ Arthritis   '[]'$ Joint swelling   [  x]Joint pain   '[]'$ Low back pain Hematologic:  '[]'$ Easy bruising  '[]'$ Easy bleeding   '[]'$ Hypercoagulable state   '[x]'$ Anemic  '[]'$ Hepatitis Gastrointestinal:  '[]'$ Blood in stool   '[]'$ Vomiting blood  '[]'$ Gastroesophageal reflux/heartburn   '[]'$ Abdominal pain Genitourinary:  '[]'$ Chronic kidney disease   '[]'$ Difficult urination  '[]'$ Frequent urination  '[]'$ Burning with urination   '[]'$ Hematuria Skin:  '[]'$ Rashes   '[]'$ Ulcers   '[]'$ Wounds Psychological:  '[]'$ History of anxiety   '[]'$  History of major depression.    Physical Exam BP (!) 161/69 (BP Location: Left Arm)   Pulse 73   Resp 16   Wt 115 lb 9.6 oz (52.4 kg)   BMI 19.84 kg/m  Gen:  WD/WN, NAD Head: [place]/AT, No temporalis wasting.  Ear/Nose/Throat: Hearing grossly intact, nares w/o erythema or drainage, oropharynx w/o Erythema/Exudate Eyes: Conjunctiva clear, sclera non-icteric  Neck: trachea midline.  No JVD.  Right carotid bruit present. Pulmonary:  Good air movement, respirations not labored, no use of accessory muscles  Cardiac: RRR, no JVD Vascular:  Vessel Right Left  Radial Palpable Palpable                                    Musculoskeletal: M/S 5/5 throughout.  Extremities without ischemic changes.  No deformity or atrophy. No edema. Neurologic: Sensation grossly intact in extremities.  Symmetrical.  Speech is fluent. Motor exam as listed above. Psychiatric: Judgment intact, Mood & affect appropriate for pt's clinical situation. Dermatologic: No rashes or ulcers noted.  No cellulitis or open wounds.    Radiology No results found.  Labs No results found for this or any previous visit (from the past 2160 hour(s)).  Assessment/Plan:  Hypertension blood pressure control important in reducing the progression of atherosclerotic disease. On appropriate oral medications.   Hyperlipidemia lipid control important in reducing the progression of atherosclerotic disease. Continue statin therapy   Carotid stenosis, asymptomatic, bilateral The patient has had a recent duplex showing 50-69% right ICA stenosis and <50% left ICA stenosis.  This would be slight progression from previous study back in 2020 that showed fairly mild disease bilaterally.   Recommend:  Given the patient's asymptomatic subcritical stenosis no further invasive testing or surgery at this time.   Continue antiplatelet therapy as prescribed. Recommend 81 mg ASA daily. Continue management of CAD, HTN and Hyperlipidemia Healthy heart diet,  encouraged exercise at least 4 times per week Follow up in 6 months with duplex ultrasound and physical exam       Leotis Pain 02/20/2023, 2:15 PM   This note was created with Dragon medical transcription system.  Any errors from dictation are unintentional.

## 2023-02-20 NOTE — Assessment & Plan Note (Signed)
The patient has had a recent duplex showing 50-69% right ICA stenosis and <50% left ICA stenosis.  This would be slight progression from previous study back in 2020 that showed fairly mild disease bilaterally.   Recommend:  Given the patient's asymptomatic subcritical stenosis no further invasive testing or surgery at this time.   Continue antiplatelet therapy as prescribed. Recommend 81 mg ASA daily. Continue management of CAD, HTN and Hyperlipidemia Healthy heart diet,  encouraged exercise at least 4 times per week Follow up in 6 months with duplex ultrasound and physical exam

## 2023-02-20 NOTE — Assessment & Plan Note (Signed)
blood pressure control important in reducing the progression of atherosclerotic disease. On appropriate oral medications.  

## 2023-02-20 NOTE — Assessment & Plan Note (Signed)
lipid control important in reducing the progression of atherosclerotic disease. Continue statin therapy  

## 2023-08-21 ENCOUNTER — Encounter (INDEPENDENT_AMBULATORY_CARE_PROVIDER_SITE_OTHER): Payer: Self-pay | Admitting: Vascular Surgery

## 2023-08-21 ENCOUNTER — Ambulatory Visit (INDEPENDENT_AMBULATORY_CARE_PROVIDER_SITE_OTHER): Payer: PRIVATE HEALTH INSURANCE

## 2023-08-21 ENCOUNTER — Ambulatory Visit (INDEPENDENT_AMBULATORY_CARE_PROVIDER_SITE_OTHER): Payer: PRIVATE HEALTH INSURANCE | Admitting: Vascular Surgery

## 2023-08-21 VITALS — BP 162/69 | HR 66 | Resp 18 | Ht 64.0 in | Wt 113.4 lb

## 2023-08-21 DIAGNOSIS — I6523 Occlusion and stenosis of bilateral carotid arteries: Secondary | ICD-10-CM

## 2023-08-21 DIAGNOSIS — E782 Mixed hyperlipidemia: Secondary | ICD-10-CM | POA: Diagnosis not present

## 2023-08-21 DIAGNOSIS — I10 Essential (primary) hypertension: Secondary | ICD-10-CM

## 2023-08-21 NOTE — Assessment & Plan Note (Signed)
Carotid duplex today shows velocities just into the 40 to 59% range on the right and at the upper end of the 1 to 39% range on the left without significant progression from previous studies.  No role for intervention at this level.  Continue Lipitor.  Recheck in 1 year with duplex.

## 2023-08-21 NOTE — Progress Notes (Signed)
MRN : [id number]  Kelly Shea is a 89 y.o. (1931/02/10) female who presents with chief complaint of  Chief Complaint  Patient presents with   Follow-up    f/u in 6 months with carotid -  .  History of Present Illness: Patient returns in follow-up of her carotid disease.  She remains active and vigorous despite having her 89th birthday coming up in several weeks.  She has not had any focal neurologic symptoms of cerebrovascular ischemia. Specifically, the patient denies amaurosis fugax, speech or swallowing difficulties, or arm or leg weakness or numbness. Carotid duplex today shows velocities just into the 40 to 59% range on the right and at the upper end of the 1 to 39% range on the left without significant progression from previous studies.   Current Outpatient Medications  Medication Sig Dispense Refill   atorvastatin (LIPITOR) 20 MG tablet Take 20 mg by mouth daily.  1   cetirizine (ZYRTEC) 10 MG tablet Take 10 mg by mouth daily.     hydrOXYzine (ATARAX/VISTARIL) 25 MG tablet      magnesium oxide (MAG-OX) 400 MG tablet Take 400 mg by mouth daily.     Multiple Vitamins-Minerals (PRESERVISION AREDS 2 PO) Take by mouth.     olmesartan (BENICAR) 40 MG tablet Take 40 mg by mouth daily.  6   omeprazole (PRILOSEC) 20 MG capsule Take by mouth daily.  5   propranolol (INDERAL) 10 MG tablet Take 1 tablet by mouth 2 (two) times daily.     Red Yeast Rice Extract (RED YEAST RICE PO) Take by mouth.     No current facility-administered medications for this visit.    Past Medical History:  Diagnosis Date   Hyperlipidemia    Hypertension     Past Surgical History:  Procedure Laterality Date   APPENDECTOMY     CHOLECYSTECTOMY       Social History   Tobacco Use   Smoking status: Never   Smokeless tobacco: Never  Substance Use Topics   Alcohol use: Never   Drug use: Never      Family History  Problem Relation Age of Onset   Heart disease Mother    Heart disease Father     Lung cancer Brother      Allergies  Allergen Reactions   Nitrofurantoin Other (See Comments)   Statins Other (See Comments)    EXCEPT LOW DOSE CRESTOR     REVIEW OF SYSTEMS (Negative unless checked)  Constitutional: [] Weight loss  [] Fever  [] Chills Cardiac: [] Chest pain   [] Chest pressure   [] Palpitations   [] Shortness of breath when laying flat   [] Shortness of breath at rest   [] Shortness of breath with exertion. Vascular:  [] Pain in legs with walking   [] Pain in legs at rest   [] Pain in legs when laying flat   [] Claudication   [] Pain in feet when walking  [] Pain in feet at rest  [] Pain in feet when laying flat   [] History of DVT   [] Phlebitis   [] Swelling in legs   [] Varicose veins   [] Non-healing ulcers Pulmonary:   [] Uses home oxygen   [] Productive cough   [] Hemoptysis   [] Wheeze  [] COPD   [] Asthma Neurologic:  [] Dizziness  [] Blackouts   [] Seizures   [] History of stroke   [] History of TIA  [] Aphasia   [] Temporary blindness   [] Dysphagia   [] Weakness or numbness in arms   [] Weakness or numbness in legs Musculoskeletal:  [x] Arthritis   [] Joint swelling   [  x]Joint pain   [] Low back pain Hematologic:  [] Easy bruising  [] Easy bleeding   [] Hypercoagulable state   [x] Anemic  [] Hepatitis Gastrointestinal:  [] Blood in stool   [] Vomiting blood  [] Gastroesophageal reflux/heartburn   [] Difficulty swallowing. Genitourinary:  [] Chronic kidney disease   [] Difficult urination  [] Frequent urination  [] Burning with urination   [] Blood in urine Skin:  [] Rashes   [] Ulcers   [] Wounds Psychological:  [] History of anxiety   []  History of major depression.  Physical Examination  Vitals:   08/21/23 1405  BP: (!) 162/69  Pulse: 66  Resp: 18  Weight: 113 lb 6.4 oz (51.4 kg)  Height: 5\' 4"  (1.626 m)   Body mass index is 19.47 kg/m. Gen:  WD/WN, NAD. Appears younger than stated age. Head: [place]/AT, No temporalis wasting. Ear/Nose/Throat: Hearing grossly intact, nares w/o erythema or drainage, trachea  midline Eyes: Conjunctiva clear. Sclera non-icteric Neck: Supple.  No bruit  Pulmonary:  Good air movement, equal and clear to auscultation bilaterally.  Cardiac: RRR, No JVD Vascular:  Vessel Right Left  Radial Palpable Palpable               Musculoskeletal: M/S 5/5 throughout.  No deformity or atrophy. No edema. Neurologic: CN 2-12 intact. Sensation grossly intact in extremities.  Symmetrical.  Speech is fluent. Motor exam as listed above. Psychiatric: Judgment intact, Mood & affect appropriate for pt's clinical situation. Dermatologic: No rashes or ulcers noted.  No cellulitis or open wounds.     CBC No results found for: "WBC", "HGB", "HCT", "MCV", "PLT"  BMET No results found for: "NA", "K", "CL", "CO2", "GLUCOSE", "BUN", "CREATININE", "CALCIUM", "GFRNONAA", "GFRAA" CrCl cannot be calculated (No successful lab value found.).  COAG No results found for: "INR", "PROTIME"  Radiology No results found.   Assessment/Plan Carotid stenosis, asymptomatic, bilateral Carotid duplex today shows velocities just into the 40 to 59% range on the right and at the upper end of the 1 to 39% range on the left without significant progression from previous studies.  No role for intervention at this level.  Continue Lipitor.  Recheck in 1 year with duplex.  Hypertension blood pressure control important in reducing the progression of atherosclerotic disease. On appropriate oral medications.     Hyperlipidemia lipid control important in reducing the progression of atherosclerotic disease. Continue statin therapy  Festus Barren, MD  08/21/2023 2:38 PM    This note was created with Dragon medical transcription system.  Any errors from dictation are purely unintentional

## 2024-08-14 ENCOUNTER — Other Ambulatory Visit (INDEPENDENT_AMBULATORY_CARE_PROVIDER_SITE_OTHER): Payer: Self-pay | Admitting: Vascular Surgery

## 2024-08-14 DIAGNOSIS — I6523 Occlusion and stenosis of bilateral carotid arteries: Secondary | ICD-10-CM

## 2024-08-26 ENCOUNTER — Ambulatory Visit (INDEPENDENT_AMBULATORY_CARE_PROVIDER_SITE_OTHER): Payer: PRIVATE HEALTH INSURANCE | Admitting: Vascular Surgery

## 2024-08-26 ENCOUNTER — Encounter (INDEPENDENT_AMBULATORY_CARE_PROVIDER_SITE_OTHER): Payer: Medicare Other

## 2024-10-28 ENCOUNTER — Ambulatory Visit (INDEPENDENT_AMBULATORY_CARE_PROVIDER_SITE_OTHER): Admitting: Vascular Surgery

## 2024-10-28 ENCOUNTER — Ambulatory Visit (INDEPENDENT_AMBULATORY_CARE_PROVIDER_SITE_OTHER)

## 2024-10-28 ENCOUNTER — Encounter (INDEPENDENT_AMBULATORY_CARE_PROVIDER_SITE_OTHER): Payer: Self-pay | Admitting: Vascular Surgery

## 2024-10-28 VITALS — BP 106/64 | HR 70 | Resp 18 | Ht 64.0 in | Wt 110.4 lb

## 2024-10-28 DIAGNOSIS — I10 Essential (primary) hypertension: Secondary | ICD-10-CM | POA: Diagnosis not present

## 2024-10-28 DIAGNOSIS — E782 Mixed hyperlipidemia: Secondary | ICD-10-CM | POA: Diagnosis not present

## 2024-10-28 DIAGNOSIS — I6523 Occlusion and stenosis of bilateral carotid arteries: Secondary | ICD-10-CM

## 2024-10-28 NOTE — Assessment & Plan Note (Signed)
 Carotid duplex today demonstrates slight progression on each side now with 40 to 59% stenosis in both carotid arteries by duplex criteria.  No role for intervention at this level.  Continue to follow annually with duplex.

## 2024-10-28 NOTE — Progress Notes (Signed)
 MRN : [id number]  Kelly Shea is a 89 y.o. (1931/11/14) female who presents with chief complaint of  Chief Complaint  Patient presents with   Follow-up    Follow up carotid ref. sparks  .  History of Present Illness: Patient returns today in follow up of her carotid disease.  She is doing well today.  She denies any focal neurologic symptoms. Specifically, the patient denies amaurosis fugax, speech or swallowing difficulties, or arm or leg weakness or numbness.  Carotid duplex today demonstrates slight progression on each side now with 40 to 59% stenosis in both carotid arteries by duplex criteria.  Current Outpatient Medications  Medication Sig Dispense Refill   atorvastatin (LIPITOR) 20 MG tablet Take 20 mg by mouth daily.  1   cetirizine (ZYRTEC) 10 MG tablet Take 10 mg by mouth daily.     hydrOXYzine (ATARAX/VISTARIL) 25 MG tablet      magnesium oxide (MAG-OX) 400 MG tablet Take 400 mg by mouth daily.     Multiple Vitamins-Minerals (PRESERVISION AREDS 2 PO) Take by mouth.     olmesartan (BENICAR) 40 MG tablet Take 40 mg by mouth daily.  6   omeprazole (PRILOSEC) 20 MG capsule Take by mouth daily.  5   propranolol (INDERAL) 10 MG tablet Take 1 tablet by mouth 2 (two) times daily.     Red Yeast Rice Extract (RED YEAST RICE PO) Take by mouth.     No current facility-administered medications for this visit.    Past Medical History:  Diagnosis Date   Hyperlipidemia    Hypertension     Past Surgical History:  Procedure Laterality Date   APPENDECTOMY     CHOLECYSTECTOMY       Social History   Tobacco Use   Smoking status: Never   Smokeless tobacco: Never  Substance Use Topics   Alcohol use: Never   Drug use: Never      Family History  Problem Relation Age of Onset   Heart disease Mother    Heart disease Father    Lung cancer Brother      Allergies  Allergen Reactions   Nitrofurantoin Other (See Comments)   Statins Other (See Comments)    EXCEPT LOW  DOSE CRESTOR       REVIEW OF SYSTEMS (Negative unless checked)   Constitutional: [] Weight loss  [] Fever  [] Chills Cardiac: [] Chest pain   [] Chest pressure   [] Palpitations   [] Shortness of breath when laying flat   [] Shortness of breath at rest   [] Shortness of breath with exertion. Vascular:  [] Pain in legs with walking   [] Pain in legs at rest   [] Pain in legs when laying flat   [] Claudication   [] Pain in feet when walking  [] Pain in feet at rest  [] Pain in feet when laying flat   [] History of DVT   [] Phlebitis   [] Swelling in legs   [] Varicose veins   [] Non-healing ulcers Pulmonary:   [] Uses home oxygen   [] Productive cough   [] Hemoptysis   [] Wheeze  [] COPD   [] Asthma Neurologic:  [] Dizziness  [] Blackouts   [] Seizures   [] History of stroke   [] History of TIA  [] Aphasia   [] Temporary blindness   [] Dysphagia   [] Weakness or numbness in arms   [] Weakness or numbness in legs Musculoskeletal:  [x] Arthritis   [] Joint swelling   [x] Joint pain   [] Low back pain Hematologic:  [] Easy bruising  [] Easy bleeding   [] Hypercoagulable state   [x] Anemic  [] Hepatitis  Gastrointestinal:  [] Blood in stool   [] Vomiting blood  [] Gastroesophageal reflux/heartburn   [] Difficulty swallowing. Genitourinary:  [] Chronic kidney disease   [] Difficult urination  [] Frequent urination  [] Burning with urination   [] Blood in urine Skin:  [] Rashes   [] Ulcers   [] Wounds Psychological:  [] History of anxiety   []  History of major depression  Physical Examination  BP 106/64 (BP Location: Right Arm, Patient Position: Sitting, Cuff Size: Normal)   Pulse 70   Resp 18   Ht 5' 4 (1.626 m)   Wt 110 lb 6.4 oz (50.1 kg)   BMI 18.95 kg/m  Gen:  WD/WN, NAD. Appears younger than stated age. Head: [place]/AT, No temporalis wasting. Ear/Nose/Throat: Hearing diminished, nares w/o erythema or drainage Eyes: Conjunctiva clear. Sclera non-icteric Neck: Supple.  Trachea midline. Soft bilateral carotid bruits. Pulmonary:  Good air movement, no  use of accessory muscles.  Cardiac: RRR, no JVD Vascular:  Vessel Right Left  Radial Palpable Palpable                   Musculoskeletal: M/S 5/5 throughout.  No deformity or atrophy. No edema. Neurologic: Sensation grossly intact in extremities.  Symmetrical.  Speech is fluent.  Psychiatric: Judgment intact, Mood & affect appropriate for pt's clinical situation. Dermatologic: No rashes or ulcers noted.  No cellulitis or open wounds.      Labs No results found for this or any previous visit (from the past 2160 hours).  Radiology No results found.  Assessment/Plan  Carotid stenosis, asymptomatic, bilateral Carotid duplex today demonstrates slight progression on each side now with 40 to 59% stenosis in both carotid arteries by duplex criteria.  No role for intervention at this level.  Continue to follow annually with duplex.  Hypertension blood pressure control important in reducing the progression of atherosclerotic disease. On appropriate oral medications.     Hyperlipidemia lipid control important in reducing the progression of atherosclerotic disease. Continue statin therapy  Selinda Gu, MD  10/28/2024 1:59 PM    This note was created with Dragon medical transcription system.  Any errors from dictation are purely unintentional

## 2025-10-27 ENCOUNTER — Ambulatory Visit (INDEPENDENT_AMBULATORY_CARE_PROVIDER_SITE_OTHER): Admitting: Vascular Surgery

## 2025-10-27 ENCOUNTER — Encounter (INDEPENDENT_AMBULATORY_CARE_PROVIDER_SITE_OTHER)
